# Patient Record
Sex: Male | Born: 1970 | Race: Black or African American | Hispanic: No | Marital: Married | Smoking: Never smoker
Health system: Southern US, Community
[De-identification: ages and names within clinical notes are randomized; demographics above are authoritative.]

## PROBLEM LIST (undated history)

## (undated) DIAGNOSIS — E119 Type 2 diabetes mellitus without complications: Secondary | ICD-10-CM

---

## 2015-12-06 ENCOUNTER — Emergency Department (HOSPITAL_COMMUNITY): Payer: Self-pay

## 2015-12-06 ENCOUNTER — Encounter (HOSPITAL_COMMUNITY): Payer: Self-pay | Admitting: *Deleted

## 2015-12-06 ENCOUNTER — Emergency Department (HOSPITAL_COMMUNITY)
Admission: EM | Admit: 2015-12-06 | Discharge: 2015-12-07 | Disposition: A | Discharge status: Home / Self Care | Payer: Self-pay | Attending: Emergency Medicine | Admitting: Emergency Medicine

## 2015-12-06 DIAGNOSIS — S41011A Laceration without foreign body of right shoulder, initial encounter: Secondary | ICD-10-CM | POA: Insufficient documentation

## 2015-12-06 DIAGNOSIS — N2889 Other specified disorders of kidney and ureter: Secondary | ICD-10-CM | POA: Insufficient documentation

## 2015-12-06 DIAGNOSIS — S41012A Laceration without foreign body of left shoulder, initial encounter: Secondary | ICD-10-CM | POA: Insufficient documentation

## 2015-12-06 DIAGNOSIS — Y9301 Activity, walking, marching and hiking: Secondary | ICD-10-CM | POA: Insufficient documentation

## 2015-12-06 DIAGNOSIS — T148XXA Other injury of unspecified body region, initial encounter: Secondary | ICD-10-CM

## 2015-12-06 DIAGNOSIS — Y999 Unspecified external cause status: Secondary | ICD-10-CM | POA: Insufficient documentation

## 2015-12-06 DIAGNOSIS — Y9289 Other specified places as the place of occurrence of the external cause: Secondary | ICD-10-CM | POA: Insufficient documentation

## 2015-12-06 LAB — CBC
HCT: 47.8 % (ref 39.0–52.0)
HEMOGLOBIN: 17.1 g/dL — AB (ref 13.0–17.0)
MCH: 30.6 pg (ref 26.0–34.0)
MCHC: 35.8 g/dL (ref 30.0–36.0)
MCV: 85.5 fL (ref 78.0–100.0)
Platelets: 224 10*3/uL (ref 150–400)
RBC: 5.59 MIL/uL (ref 4.22–5.81)
RDW: 12.6 % (ref 11.5–15.5)
WBC: 6.7 10*3/uL (ref 4.0–10.5)

## 2015-12-06 LAB — I-STAT CHEM 8, ED
BUN: 28 mg/dL — AB (ref 6–20)
CALCIUM ION: 1.03 mmol/L — AB (ref 1.15–1.40)
CHLORIDE: 107 mmol/L (ref 101–111)
Creatinine, Ser: 2 mg/dL — ABNORMAL HIGH (ref 0.61–1.24)
Glucose, Bld: 148 mg/dL — ABNORMAL HIGH (ref 65–99)
HEMATOCRIT: 49 % (ref 39.0–52.0)
Hemoglobin: 16.7 g/dL (ref 13.0–17.0)
Potassium: 3.4 mmol/L — ABNORMAL LOW (ref 3.5–5.1)
SODIUM: 140 mmol/L (ref 135–145)
TCO2: 19 mmol/L (ref 0–100)

## 2015-12-06 LAB — I-STAT CG4 LACTIC ACID, ED: Lactic Acid, Venous: 6.43 mmol/L (ref 0.5–1.9)

## 2015-12-06 LAB — SAMPLE TO BLOOD BANK

## 2015-12-06 LAB — PROTIME-INR
INR: 1.06
Prothrombin Time: 13.9 seconds (ref 11.4–15.2)

## 2015-12-06 MED ORDER — IOPAMIDOL (ISOVUE-370) INJECTION 76%
INTRAVENOUS | Status: AC
  Administered 2015-12-06: 100 mL via INTRAVENOUS
  Filled 2015-12-06: qty 100

## 2015-12-06 MED ORDER — MORPHINE SULFATE (PF) 4 MG/ML IV SOLN
4.0000 mg | Freq: Once | INTRAVENOUS | Status: AC
  Administered 2015-12-06: 4 mg via INTRAVENOUS
  Filled 2015-12-06: qty 1

## 2015-12-06 MED ORDER — SODIUM CHLORIDE 0.9 % IV BOLUS (SEPSIS)
1000.0000 mL | Freq: Once | INTRAVENOUS | Status: AC
  Administered 2015-12-06: 1000 mL via INTRAVENOUS
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
## 2015-12-07 ENCOUNTER — Emergency Department (HOSPITAL_COMMUNITY): Payer: Self-pay

## 2015-12-07 LAB — COMPREHENSIVE METABOLIC PANEL
ALT: 20 U/L (ref 17–63)
AST: 35 U/L (ref 15–41)
Albumin: 4.7 g/dL (ref 3.5–5.0)
Alkaline Phosphatase: 62 U/L (ref 38–126)
Anion gap: 14 (ref 5–15)
BILIRUBIN TOTAL: 0.7 mg/dL (ref 0.3–1.2)
BUN: 26 mg/dL — AB (ref 6–20)
CO2: 18 mmol/L — ABNORMAL LOW (ref 22–32)
CREATININE: 1.96 mg/dL — AB (ref 0.61–1.24)
Calcium: 9.2 mg/dL (ref 8.9–10.3)
Chloride: 106 mmol/L (ref 101–111)
GFR, EST AFRICAN AMERICAN: 45 mL/min — AB (ref >60)
GFR, EST NON AFRICAN AMERICAN: 39 mL/min — AB (ref >60)
Glucose, Bld: 155 mg/dL — ABNORMAL HIGH (ref 65–99)
POTASSIUM: 3.5 mmol/L (ref 3.5–5.1)
Sodium: 138 mmol/L (ref 135–145)
TOTAL PROTEIN: 7.3 g/dL (ref 6.5–8.1)

## 2015-12-07 LAB — CDS SEROLOGY

## 2015-12-07 LAB — I-STAT CG4 LACTIC ACID, ED: LACTIC ACID, VENOUS: 1.12 mmol/L (ref 0.5–1.9)

## 2015-12-07 LAB — ETHANOL

## 2015-12-07 MED ORDER — LIDOCAINE-EPINEPHRINE (PF) 2 %-1:200000 IJ SOLN
10.0000 mL | Freq: Once | INTRAMUSCULAR | Status: AC
  Administered 2015-12-07: 10 mL
  Filled 2015-12-07: qty 10

## 2015-12-07 MED ORDER — HYDROCODONE-ACETAMINOPHEN 5-325 MG PO TABS: 1.0000 | ORAL_TABLET | Freq: Four times a day (QID) | ORAL | 0 refills | Status: DC | PRN

## 2015-12-07 MED ORDER — GADOBENATE DIMEGLUMINE 529 MG/ML IV SOLN
15.0000 mL | Freq: Once | INTRAVENOUS | Status: AC | PRN
  Administered 2015-12-07: 15 mL via INTRAVENOUS

## 2015-12-07 NOTE — ED Notes (Signed)
Drink given per Dr Wilkie AyeHorton

## 2015-12-07 NOTE — ED Notes (Signed)
Patient returned from MRI.

## 2015-12-07 NOTE — ED Notes (Signed)
Patient to MRI.

## 2015-12-07 NOTE — ED Notes (Signed)
Discharge instructions reviewed - voiced understanding 

## 2015-12-07 NOTE — ED Provider Notes (Signed)
LACERATION REPAIR Performed by: [name],Joseph Hubbard Authorized by: [name],Joseph Hubbard Consent: Verbal consent obtained. Risks and benefits: risks, benefits and alternatives were discussed Consent given by: patient Patient identity confirmed: provided demographic data Prepped and Draped in normal sterile fashion Wound explored  Laceration Location: left shoulder  Laceration Length:  5cm  No Foreign Bodies seen or palpated  Anesthesia: pt declined  Skin closure: staples  Number of sutures: 5  Technique: staples  Patient tolerance: Patient tolerated the procedure well with no immediate complications.  Pt seen by Dr. Wilkie AyeHorton please refer to her note for HPI, ROS, PE, and dispo.   [name], PA-C 12/07/15 0301    Gilda Creasehristopher J Pollina, MD 12/08/15 [phone]

## 2015-12-07 NOTE — ED Provider Notes (Signed)
8:00 AM  Called from radiology.  Overread and final read of MRI.  Mass concerning for renal cell carcinoma.  I attempted to contact patient via phone.  No answer.  [facility] RN to contact patient in regards to final read and need for urology follow-up.  Additionally, note left with case management to contact patient to help with setting up follow-up and PCP.   Shon Batonourtney F Horton, MD 12/07/15 [phone]
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
## 2015-12-09 ENCOUNTER — Encounter (HOSPITAL_COMMUNITY): Payer: Self-pay | Admitting: Urology

## 2016-04-17 ENCOUNTER — Ambulatory Visit (HOSPITAL_COMMUNITY)
Admission: EM | Admit: 2016-04-17 | Discharge: 2016-04-17 | Disposition: A | Discharge status: Home / Self Care | Payer: Self-pay | Attending: Internal Medicine | Admitting: Internal Medicine

## 2016-04-17 ENCOUNTER — Encounter (HOSPITAL_COMMUNITY): Payer: Self-pay | Admitting: *Deleted

## 2016-04-17 DIAGNOSIS — K2100 Gastro-esophageal reflux disease with esophagitis, without bleeding: Secondary | ICD-10-CM

## 2016-04-17 DIAGNOSIS — R1084 Generalized abdominal pain: Secondary | ICD-10-CM

## 2016-04-17 DIAGNOSIS — R197 Diarrhea, unspecified: Secondary | ICD-10-CM

## 2016-04-17 NOTE — Discharge Instructions (Signed)
The symptoms your describing for the upper abdominal discomfort is likely due to reflux of the stomach contents into the esophagus. Recommend taking Prilosec, comes in generic, and Zantac 150 mg twice a day. For diarrhea take Imodium, one now and may repeat in 4-6 hours if continuing to have diarrhea at the same rate. Do not take more than 3 a day. Do not stop the diarrhea. He must have some diarrhea to get rid of the infection. If you are not getting better in a couple days or get worse or see blood in the stool or start vomiting and cannot keep liquids down need to seek medical attention promptly.

## 2016-04-17 NOTE — ED Triage Notes (Signed)
Pt  Reports   abd  Gas  Pressure         Nausea   vomiting    And   Diarrhea   As   Well  X  5  Days         Last  Times   Vomited   Was  yeterday

## 2016-04-17 NOTE — ED Provider Notes (Signed)
CSN: [id number]     Arrival date & time 04/17/16  1258 History   First MD Initiated Contact with Patient 04/17/16 1425     Chief Complaint  Patient presents with  . Abdominal Pain   (Consider location/radiation/quality/duration/timing/severity/associated sxs/prior Treatment) 46 year old male complaining of pain across the upper abdomen and epigastrium for 3 days. It is associated with reflux symptoms that often go to the throat. Sometimes brushing his teeth he will gag and experience reflux into his throat. Proximally 4 days ago he started having diarrhea. He would hear louder bowel sounds and having may be 5 or 6 loose swallow water he stools per day. Occasionally has discomfort across the lower abdomen. He has been taking Pepto-Bismol almost daily. He has not seen in any bright red bleeding. States he ate some food with some greens on top a few days ago and he attributed his illness to that. No fever or chills. No shortness of breath, cough.      History reviewed. No pertinent past medical history. History reviewed. No pertinent surgical history. History reviewed. No pertinent family history. Social History  Substance Use Topics  . Smoking status: Never Smoker  . Smokeless tobacco: Never Used  . Alcohol use No    Review of Systems  Constitutional: Negative for activity change, fatigue and fever.  HENT: Negative.   Respiratory: Negative.   Gastrointestinal: Negative for constipation and vomiting.       See history of present illness  Genitourinary: Negative.   Musculoskeletal: Negative.   Neurological: Negative.   All other systems reviewed and are negative.   Allergies  Patient has no known allergies.  Home Medications   Prior to Admission medications   Not on File   Meds Ordered and Administered this Visit  Medications - No data to display  BP 124/71 (BP Location: Right Arm)   Pulse 60   Temp 98.6 F (37 C) (Oral)   Resp 18   SpO2 100%  No data  found.   Physical Exam  Constitutional: He is oriented to person, place, and time. He appears well-developed and well-nourished. No distress.  HENT:  Head: Normocephalic and atraumatic.  Eyes: EOM are normal.  Neck: Neck supple.  Cardiovascular: Normal rate and regular rhythm.   Pulmonary/Chest: Effort normal and breath sounds normal. No respiratory distress. He has no wheezes.  Abdominal: Soft. He exhibits no mass.  Bowel sounds hyperactive. Minor abdominal distention. Palpation of the upper abdomen with tympany most other areas dull. No areas with tenderness, rebound or guarding.  Musculoskeletal: He exhibits no edema.  Neurological: He is alert and oriented to person, place, and time.  Skin: Skin is warm and dry.  Psychiatric: He has a normal mood and affect.  Nursing note and vitals reviewed.   Urgent Care Course     Procedures (including critical care time)  Labs Review Labs Reviewed - No data to display  Imaging Review No results found.   Visual Acuity Review  Right Eye Distance:   Left Eye Distance:   Bilateral Distance:    Right Eye Near:   Left Eye Near:    Bilateral Near:         MDM   1. Diarrhea, unspecified type   2. Reflux esophagitis   3. Generalized abdominal pain    The symptoms your describing for the upper abdominal discomfort is likely due to reflux of the stomach contents into the esophagus. Recommend taking Prilosec, comes in generic, and Zantac 150 mg twice a  day. For diarrhea take Imodium, one now and may repeat in 4-6 hours if continuing to have diarrhea at the same rate. Do not take more than 3 a day. Do not stop the diarrhea. He must have some diarrhea to get rid of the infection. If you are not getting better in a couple days or get worse or see blood in the stool or start vomiting and cannot keep liquids down need to seek medical attention promptly.    Hayden Rasmussenavid Kristi Norment, NP 04/17/16 [phone]
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
## 2021-01-30 ENCOUNTER — Other Ambulatory Visit (HOSPITAL_COMMUNITY): Payer: Self-pay

## 2021-01-30 ENCOUNTER — Telehealth: Payer: Self-pay

## 2021-01-30 NOTE — Telephone Encounter (Signed)
[facility] Patient Product/process development scientist completed.    The patient is insured through [employer].  [employer] $1062.69  [employer] $2800.00  Patient will need a Copay Coupon Card / sign up for [employer] .  We will continue to follow to see if copay assistance is needed.  Clearance Coots, CPhT Specialty Pharm[facility] Patient [facility] for Infectious Disease Phone: [phone] Fax:  [phone]

## 2021-01-31 ENCOUNTER — Ambulatory Visit: Payer: Self-pay | Admitting: Internal Medicine

## 2021-01-31 NOTE — Progress Notes (Deleted)
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 male with PMHx as below who presents to the clinic for discussion of a false positive HIV test.   ***  Patient's Medications   No medications on file      No past medical history on file.  Social History   Tobacco Use   Smoking status: Never   Smokeless tobacco: Never  Substance Use Topics   Alcohol use: No    No family history on file.  No Known Allergies  ROS    OBJECTIVE:    There were no vitals filed for this visit.   There is no height or weight on file to calculate BMI.  Physical Exam   Labs and Microbiology:  CBC Latest Ref Rng & Units 12/06/2015 12/06/2015  WBC 4.0 - 10.5 K/uL - 6.7  Hemoglobin 13.0 - 17.0 g/dL 16.7 17.1(H)  Hematocrit 39.0 - 52.0 % 49.0 47.8  Platelets 150 - 400 K/uL - 224   CMP Latest Ref Rng & Units 12/06/2015 12/06/2015  Glucose 65 - 99 mg/dL 148(H) 155(H)  BUN 6 - 20 mg/dL 28(H) 26(H)  Creatinine 0.61 - 1.24 mg/dL 2.00(H) 1.96(H)  Sodium 135 - 145 mmol/L 140 138  Potassium 3.5 - 5.1 mmol/L 3.4(L) 3.5  Chloride 101 - 111 mmol/L 107 106  CO2 22 - 32 mmol/L - 18(L)  Calcium 8.9 - 10.3 mg/dL - 9.2  Total Protein 6.5 - 8.1 g/dL - 7.3  Total Bilirubin 0.3 - 1.2 mg/dL - 0.7  Alkaline Phos 38 - 126 U/L - 62  AST 15 - 41 U/L - 35  ALT 17 - 63 U/L - 20     No results found for this or any previous visit (from the past 240 hour(s)).  Imaging: ***   ASSESSMENT & PLAN:    No problem-specific Assessment & Plan notes found for this encounter.   No orders of the defined types were placed in this encounter.     ***  Rayn[facility] 01/31/2021, 5:38 AM

## 2022-04-02 ENCOUNTER — Other Ambulatory Visit: Payer: Self-pay | Admitting: Physician Assistant

## 2022-04-02 DIAGNOSIS — R748 Abnormal levels of other serum enzymes: Secondary | ICD-10-CM

## 2022-04-23 ENCOUNTER — Ambulatory Visit
Admission: RE | Admit: 2022-04-23 | Discharge: 2022-04-23 | Disposition: A | Discharge status: Home / Self Care | Payer: Self-pay | Source: Ambulatory Visit | Attending: Physician Assistant | Admitting: Physician Assistant

## 2022-04-23 DIAGNOSIS — R748 Abnormal levels of other serum enzymes: Secondary | ICD-10-CM

## 2022-07-31 ENCOUNTER — Encounter (HOSPITAL_COMMUNITY): Payer: Self-pay | Admitting: Emergency Medicine

## 2022-07-31 ENCOUNTER — Emergency Department (HOSPITAL_COMMUNITY): Payer: Commercial Managed Care - HMO

## 2022-07-31 ENCOUNTER — Emergency Department (HOSPITAL_COMMUNITY)
Admission: EM | Admit: 2022-07-31 | Discharge: 2022-07-31 | Disposition: A | Discharge status: Home / Self Care | Payer: Commercial Managed Care - HMO | Attending: Emergency Medicine | Admitting: Emergency Medicine

## 2022-07-31 ENCOUNTER — Other Ambulatory Visit: Payer: Self-pay

## 2022-07-31 DIAGNOSIS — E871 Hypo-osmolality and hyponatremia: Secondary | ICD-10-CM | POA: Diagnosis not present

## 2022-07-31 DIAGNOSIS — R1031 Right lower quadrant pain: Secondary | ICD-10-CM

## 2022-07-31 DIAGNOSIS — R7401 Elevation of levels of liver transaminase levels: Secondary | ICD-10-CM | POA: Insufficient documentation

## 2022-07-31 DIAGNOSIS — N281 Cyst of kidney, acquired: Secondary | ICD-10-CM | POA: Diagnosis not present

## 2022-07-31 DIAGNOSIS — E860 Dehydration: Secondary | ICD-10-CM | POA: Insufficient documentation

## 2022-07-31 DIAGNOSIS — E1165 Type 2 diabetes mellitus with hyperglycemia: Secondary | ICD-10-CM | POA: Diagnosis not present

## 2022-07-31 HISTORY — DX: Type 2 diabetes mellitus without complications: E11.9

## 2022-07-31 LAB — COMPREHENSIVE METABOLIC PANEL
ALT: 41 U/L (ref 0–44)
AST: 52 U/L — ABNORMAL HIGH (ref 15–41)
Albumin: 3.9 g/dL (ref 3.5–5.0)
Alkaline Phosphatase: 212 U/L — ABNORMAL HIGH (ref 38–126)
Anion gap: 14 (ref 5–15)
BUN: 22 mg/dL — ABNORMAL HIGH (ref 6–20)
CO2: 19 mmol/L — ABNORMAL LOW (ref 22–32)
Calcium: 8.6 mg/dL — ABNORMAL LOW (ref 8.9–10.3)
Chloride: 100 mmol/L (ref 98–111)
Creatinine, Ser: 1.14 mg/dL (ref 0.61–1.24)
GFR, Estimated: >60 mL/min (ref >60)
Glucose, Bld: 476 mg/dL — ABNORMAL HIGH (ref 70–99)
Potassium: 4.3 mmol/L (ref 3.5–5.1)
Sodium: 133 mmol/L — ABNORMAL LOW (ref 135–145)
Total Bilirubin: 1 mg/dL (ref 0.3–1.2)
Total Protein: 6.8 g/dL (ref 6.5–8.1)

## 2022-07-31 LAB — I-STAT VENOUS BLOOD GAS, ED
Acid-Base Excess: 1 mmol/L (ref 0.0–2.0)
Bicarbonate: 28.5 mmol/L — ABNORMAL HIGH (ref 20.0–28.0)
Calcium, Ion: 1.12 mmol/L — ABNORMAL LOW (ref 1.15–1.40)
HCT: 42 % (ref 39.0–52.0)
Hemoglobin: 14.3 g/dL (ref 13.0–17.0)
O2 Saturation: 91 %
Potassium: 4.4 mmol/L (ref 3.5–5.1)
Sodium: 133 mmol/L — ABNORMAL LOW (ref 135–145)
TCO2: 30 mmol/L (ref 22–32)
pCO2, Ven: 56.4 mmHg (ref 44–60)
pH, Ven: 7.312 (ref 7.25–7.43)
pO2, Ven: 68 mmHg — ABNORMAL HIGH (ref 32–45)

## 2022-07-31 LAB — URINALYSIS, ROUTINE W REFLEX MICROSCOPIC
Bacteria, UA: NONE SEEN
Bilirubin Urine: NEGATIVE
Glucose, UA: >=500 mg/dL — AB
Hgb urine dipstick: NEGATIVE
Ketones, ur: 5 mg/dL — AB
Leukocytes,Ua: NEGATIVE
Nitrite: NEGATIVE
Protein, ur: NEGATIVE mg/dL
Specific Gravity, Urine: 1.029 (ref 1.005–1.030)
pH: 5 (ref 5.0–8.0)

## 2022-07-31 LAB — CBC
HCT: 45.1 % (ref 39.0–52.0)
Hemoglobin: 16.7 g/dL (ref 13.0–17.0)
MCH: 31.5 pg (ref 26.0–34.0)
MCHC: 37 g/dL — ABNORMAL HIGH (ref 30.0–36.0)
MCV: 84.9 fL (ref 80.0–100.0)
Platelets: 221 10*3/uL (ref 150–400)
RBC: 5.31 MIL/uL (ref 4.22–5.81)
RDW: 11.9 % (ref 11.5–15.5)
WBC: 4.9 10*3/uL (ref 4.0–10.5)
nRBC: 0 % (ref 0.0–0.2)

## 2022-07-31 LAB — LIPASE, BLOOD: Lipase: 32 U/L (ref 11–51)

## 2022-07-31 MED ORDER — DICYCLOMINE HCL 20 MG PO TABS: 20.0000 mg | ORAL_TABLET | Freq: Two times a day (BID) | ORAL | 0 refills | Status: AC | PRN

## 2022-07-31 MED ORDER — MORPHINE SULFATE (PF) 4 MG/ML IV SOLN
4.0000 mg | Freq: Once | INTRAVENOUS | Status: AC
  Administered 2022-07-31: 4 mg via INTRAVENOUS
  Filled 2022-07-31: qty 1

## 2022-07-31 MED ORDER — IOHEXOL 350 MG/ML SOLN
75.0000 mL | Freq: Once | INTRAVENOUS | Status: AC | PRN
  Administered 2022-07-31: 75 mL via INTRAVENOUS

## 2022-07-31 MED ORDER — ONDANSETRON HCL 4 MG/2ML IJ SOLN
4.0000 mg | Freq: Once | INTRAMUSCULAR | Status: AC
  Administered 2022-07-31: 4 mg via INTRAVENOUS
  Filled 2022-07-31: qty 2

## 2022-07-31 MED ORDER — ONDANSETRON HCL 4 MG PO TABS: 4.0000 mg | ORAL_TABLET | Freq: Three times a day (TID) | ORAL | 0 refills | Status: AC | PRN

## 2022-07-31 NOTE — ED Provider Notes (Signed)
[place] EMERGENCY DEPARTMENT AT [facility] Provider Note   CSN: [id number] Arrival date & time: 07/31/22  1358     History  Chief Complaint  Patient presents with   Abdominal Pain    Joseph Hubbard is a 52 y.o. male with past medical history diabetes who presents to the ED complaining of right lower quadrant abdominal pain since yesterday.  No associated nausea, vomiting, diarrhea, fever, chills, constipation, dysuria or hematuria, testicular pain or swelling, or penile discharge.  States that the pain radiates around the right flank to the right lower back.  No bowel or bladder dysfunction.  No previous abdominal surgeries.  No medications taken before arrival.      Home Medications Insulin  Allergies    Patient has no known allergies.    Review of Systems   Review of Systems  All other systems reviewed and are negative.   Physical Exam Updated Vital Signs BP 119/84 (BP Location: Right Arm)   Pulse 65   Temp 98.6 F (37 C) (Oral)   Resp 14   Ht 5\' 10"  (1.778 m)   Wt 82.6 kg   SpO2 100%   BMI 26.11 kg/m  Physical Exam Vitals and nursing note reviewed.  Constitutional:      General: He is not in acute distress.    Appearance: Normal appearance.  HENT:     Head: Normocephalic and atraumatic.     Mouth/Throat:     Mouth: Mucous membranes are moist.  Eyes:     General: No scleral icterus.    Extraocular Movements: Extraocular movements intact.     Conjunctiva/sclera: Conjunctivae normal.     Pupils: Pupils are equal, round, and reactive to light.  Cardiovascular:     Rate and Rhythm: Normal rate and regular rhythm.     Heart sounds: No murmur heard. Pulmonary:     Effort: Pulmonary effort is normal.     Breath sounds: Normal breath sounds.  Abdominal:     General: Abdomen is flat.     Palpations: Abdomen is soft.     Tenderness: There is abdominal tenderness in the right lower quadrant. There is no right CVA tenderness, left CVA tenderness,  guarding or rebound. Positive signs include McBurney's sign. Negative signs include Murphy's sign and Rovsing's sign.  Musculoskeletal:        General: Normal range of motion.     Cervical back: Neck supple.     Right lower leg: No edema.     Left lower leg: No edema.  Skin:    General: Skin is warm and dry.     Capillary Refill: Capillary refill takes less than 2 seconds.  Neurological:     General: No focal deficit present.     Mental Status: He is alert and oriented to person, place, and time. Mental status is at baseline.  Psychiatric:        Mood and Affect: Mood normal.        Behavior: Behavior normal.     ED Results / Procedures / Treatments   Labs (all labs ordered are listed, but only abnormal results are displayed) Labs Reviewed  COMPREHENSIVE METABOLIC PANEL - Abnormal; Notable for the following components:      Result Value   Sodium 133 (*)    CO2 19 (*)    Glucose, Bld 476 (*)    BUN 22 (*)    Calcium 8.6 (*)    AST 52 (*)    Alkaline Phosphatase 212 (*)  All other components within normal limits  CBC - Abnormal; Notable for the following components:   MCHC 37.0 (*)    All other components within normal limits  URINALYSIS, ROUTINE W REFLEX MICROSCOPIC - Abnormal; Notable for the following components:   Color, Urine STRAW (*)    Glucose, UA >=500 (*)    Ketones, ur 5 (*)    All other components within normal limits  I-STAT VENOUS BLOOD GAS, ED - Abnormal; Notable for the following components:   pO2, Ven 68 (*)    Bicarbonate 28.5 (*)    Sodium 133 (*)    Calcium, Ion 1.12 (*)    All other components within normal limits  LIPASE, BLOOD    EKG None  Radiology CT ABDOMEN PELVIS W CONTRAST  Result Date: 07/31/2022 CLINICAL DATA:  Right lower quadrant pain EXAM: CT ABDOMEN AND PELVIS WITH CONTRAST TECHNIQUE: Multidetector CT imaging of the abdomen and pelvis was performed using the standard protocol following bolus administration of intravenous  contrast. RADIATION DOSE REDUCTION: This exam was performed according to the departmental dose-optimization program which includes automated exposure control, adjustment of the mA and/or kV according to patient size and/or use of iterative reconstruction technique. CONTRAST:  75mL OMNIPAQUE IOHEXOL 350 MG/ML SOLN COMPARISON:  None Available. FINDINGS: Lower chest: Bibasilar atelectasis.  No effusions. Hepatobiliary: No focal hepatic abnormality. Gallbladder unremarkable. Pancreas: No focal abnormality or ductal dilatation. Spleen: No focal abnormality.  Normal size. Adrenals/Urinary Tract: Partially calcified cyst in the midpole of the left kidney measures 1.5 cm. No renal or ureteral stones. No hydronephrosis. Adrenal glands and urinary bladder unremarkable. Stomach/Bowel: Normal appendix. Stomach, large and small bowel grossly unremarkable. Vascular/Lymphatic: No evidence of aneurysm or adenopathy. Reproductive: Insert nail pelvis Other: No free fluid or free air. Musculoskeletal: No acute bony abnormality. IMPRESSION: Normal appendix. No acute findings in the abdomen or pelvis. Rim calcified 1.5 cm cyst in the midpole of the left kidney. This likely reflects a complex benign renal cyst, but could be further characterized with elective nonemergent renal ultrasound. Electronically Signed   By: Charlett Nose M.D.   On: 07/31/2022 17:42    Procedures Procedures    Medications Ordered in ED Medications  morphine (PF) 4 MG/ML injection 4 mg (4 mg Intravenous Given 07/31/22 1641)  ondansetron (ZOFRAN) injection 4 mg (4 mg Intravenous Given 07/31/22 1640)  iohexol (OMNIPAQUE) 350 MG/ML injection 75 mL (75 mLs Intravenous Contrast Given 07/31/22 1727)    ED Course/ Medical Decision Making/ A&P Clinical Course as of 07/31/22 1831  Tue Jul 31, 2022  1820 Discussed findings with patient and family member at bedside.  He is resting comfortably in no acute distress.  No appendicitis, cholecystitis on CT abdomen  pelvis.  He does have incidental left renal cyst.  No pain on the left side of his abdomen or left CVA tenderness.  Urine is negative for infection. Also elevated alk phos from previous labs. Will have pt closely follow up with primary care for outpatient renal ultrasound and further evaluation.  Blood sugar is elevated in the 400s.  No anion gap.  pH is normal.  Patient newly diagnosed diabetic currently using insulin at home.  Will have him closely monitor blood sugar at home and follow-up again with PCP for discussion of further medication management. [MG]    Clinical Course User Index [MG] Tonette Lederer, PA-C  Medical Decision Making Amount and/or Complexity of Data Reviewed Labs: ordered. Decision-making details documented in ED Course. Radiology: ordered. Decision-making details documented in ED Course.  Risk Prescription drug management.   Medical Decision Making:   Joseph Hubbard is a 52 y.o. male who presented to the ED today with abdominal pain detailed above.    Patient's presentation is complicated by their history of diabetes.  Complete initial physical exam performed, notably the patient  was in no acute distress.  Moderate right lower quadrant abdominal tenderness with positive McBurney's point tenderness.  No rebound, guarding.  No CVA tenderness.  Moist mucous membranes.  Nontoxic-appearing.    Reviewed and confirmed nursing documentation for past medical history, family history, social history.    Initial Assessment:   With the patient's presentation of abdominal pain, differential diagnosis includes but is not limited to AAA, mesenteric ischemia, appendicitis, diverticulitis, DKA, gastritis, gastroenteritis, AMI, nephrolithiasis, pancreatitis, peritonitis, adrenal insufficiency, intestinal ischemia, constipation, UTI, SBO/LBO, splenic rupture, biliary disease, IBD, IBS, PUD, hepatitis, STD, ovarian/testicular torsion, electrolyte disturbance,  DKA, dehydration, acute kidney injury, renal failure, cholecystitis, cholelithiasis, choledocholithiasis, abdominal pain of  unknown etiology.   Initial Plan:  Screening labs including CBC and Metabolic panel to evaluate for infectious or metabolic etiology of disease.  Lipase to evaluate for pancreatitis Urinalysis with reflex culture ordered to evaluate for UTI or relevant urologic/nephrologic pathology.  CT abd/pelvis to evaluate for intra-abdominal pathology Symptomatic management Objective evaluation as reviewed   Initial Study Results:   Laboratory  All laboratory results reviewed without evidence of clinically relevant pathology.   Exceptions include: Sodium 133, glucose 476, alk phos 212, AST 52  Radiology:  All images reviewed independently. Agree with radiology report at this time.   CT ABDOMEN PELVIS W CONTRAST  Result Date: 07/31/2022 CLINICAL DATA:  Right lower quadrant pain EXAM: CT ABDOMEN AND PELVIS WITH CONTRAST TECHNIQUE: Multidetector CT imaging of the abdomen and pelvis was performed using the standard protocol following bolus administration of intravenous contrast. RADIATION DOSE REDUCTION: This exam was performed according to the departmental dose-optimization program which includes automated exposure control, adjustment of the mA and/or kV according to patient size and/or use of iterative reconstruction technique. CONTRAST:  75mL OMNIPAQUE IOHEXOL 350 MG/ML SOLN COMPARISON:  None Available. FINDINGS: Lower chest: Bibasilar atelectasis.  No effusions. Hepatobiliary: No focal hepatic abnormality. Gallbladder unremarkable. Pancreas: No focal abnormality or ductal dilatation. Spleen: No focal abnormality.  Normal size. Adrenals/Urinary Tract: Partially calcified cyst in the midpole of the left kidney measures 1.5 cm. No renal or ureteral stones. No hydronephrosis. Adrenal glands and urinary bladder unremarkable. Stomach/Bowel: Normal appendix. Stomach, large and small bowel  grossly unremarkable. Vascular/Lymphatic: No evidence of aneurysm or adenopathy. Reproductive: Insert nail pelvis Other: No free fluid or free air. Musculoskeletal: No acute bony abnormality. IMPRESSION: Normal appendix. No acute findings in the abdomen or pelvis. Rim calcified 1.5 cm cyst in the midpole of the left kidney. This likely reflects a complex benign renal cyst, but could be further characterized with elective nonemergent renal ultrasound. Electronically Signed   By: Charlett Nose M.D.   On: 07/31/2022 17:42    Final Assessment and Plan:   52 year old male presents to the ED with right lower quadrant abdominal pain.  Vital signs reassuring.  Patient afebrile, no associated volume losses.  No urinary symptoms.  No testicular pain or swelling.  Labs initiated as above for further assessment.  No leukocytosis.  Patient does have glucose in the 470s.  Newly diagnosed diabetic currently on  insulin regimen. No anion gap, normal pH. Appears slightly dehydrated on labs. Tolerating PO, will allow to orally rehydrate. Alk phos elevated from previous though labs are several years old. CTAP without emergent findings. Will have pt closely follow up with primary care for further management of his diabetes.  Incidental left renal cyst.  No associated pain on the left side of the abdomen or back.  Recommendation for outpatient renal ultrasound.  Discussed this with patient and family member at bedside and they are agreeable to follow-up.  Urine does not appear acutely infected.  No hematuria.  Overall no emergent findings on workup.  Patient given strict ED return precautions, all questions answered, and stable for discharge.   Clinical Impression:  1. Right lower quadrant abdominal pain   2. Renal cyst, left   3. Hyperglycemia due to diabetes mellitus (HCC)   4. Dehydration      Discharge           Final Clinical Impression(s) / ED Diagnoses Final diagnoses:  Right lower quadrant abdominal pain   Renal cyst, left  Hyperglycemia due to diabetes mellitus (HCC)  Dehydration    Rx / DC Orders ED Discharge Orders          Ordered    dicyclomine (BENTYL) 20 MG tablet  2 times daily PRN        07/31/22 1824    ondansetron (ZOFRAN) 4 MG tablet  Every 8 hours PRN        07/31/22 1824              Tonette Lederer, PA-C 07/31/22 1833    Gloris Manchester, MD 08/01/22 [phone]

## 2022-07-31 NOTE — Discharge Instructions (Addendum)
Thank you for letting us take care of you today.  We do not see any emergent problems on the CT scan of your abdomen.  Your sugar was elevated in the 400s but you did not appear to be in diabetic ketoacidosis which is a diabetic emergency. We do see an incidental cyst on your left kidney. Follow up with your PCP in the next week to discuss this, lab work today, and scheduling an ultrasound of your kidneys for better characterization of this. You appear slightly dehydrated which is also common in diabetics. I recommend increasing your water intake at home which can help with both hydration and blood sugar control.  Monitor your blood sugars over the next week until you follow up with your PCP. If they remain elevated, you may need adjustment of your diabetic medications. For new or worsening symptoms, return to nearest ED for re-evaluation.

## 2022-07-31 NOTE — ED Triage Notes (Signed)
Patient arrives ambulatory by POV c/o lower right quadrant abdominal pain radiating around into back onset of yesterday. Denies any N/V/D or any urinary symptoms.

## 2023-12-22 ENCOUNTER — Emergency Department (HOSPITAL_COMMUNITY): Payer: Self-pay

## 2023-12-22 ENCOUNTER — Emergency Department (HOSPITAL_COMMUNITY)
Admission: EM | Admit: 2023-12-22 | Discharge: 2023-12-22 | Disposition: A | Discharge status: Home / Self Care | Payer: Self-pay | Attending: Emergency Medicine | Admitting: Emergency Medicine

## 2023-12-22 ENCOUNTER — Other Ambulatory Visit: Payer: Self-pay

## 2023-12-22 DIAGNOSIS — N452 Orchitis: Secondary | ICD-10-CM | POA: Insufficient documentation

## 2023-12-22 DIAGNOSIS — E119 Type 2 diabetes mellitus without complications: Secondary | ICD-10-CM | POA: Insufficient documentation

## 2023-12-22 LAB — URINALYSIS, ROUTINE W REFLEX MICROSCOPIC
Glucose, UA: >=500 mg/dL — AB
Hgb urine dipstick: NEGATIVE
Ketones, ur: >80 mg/dL — AB
Leukocytes,Ua: NEGATIVE
Nitrite: NEGATIVE
Protein, ur: NEGATIVE mg/dL
Specific Gravity, Urine: 1.025 (ref 1.005–1.030)
pH: 6 (ref 5.0–8.0)

## 2023-12-22 LAB — URINALYSIS, MICROSCOPIC (REFLEX): Bacteria, UA: NONE SEEN

## 2023-12-22 MED ORDER — CEFTRIAXONE SODIUM 500 MG IJ SOLR
500.0000 mg | Freq: Once | INTRAMUSCULAR | Status: AC
  Administered 2023-12-22: 500 mg via INTRAMUSCULAR
  Filled 2023-12-22: qty 500

## 2023-12-22 MED ORDER — LEVOFLOXACIN 500 MG PO TABS: 500.0000 mg | ORAL_TABLET | Freq: Every day | ORAL | 0 refills | Status: AC

## 2023-12-22 NOTE — ED Provider Notes (Signed)
 [place] EMERGENCY DEPARTMENT AT [facility] Provider Note   CSN: [id number] Arrival date & time: 12/22/23  0340     Patient presents with: Male GU Problem   Joseph Hubbard is a 53 y.o. male.   53 year old male presents to the emergency department for evaluation of scrotal swelling.  He began to notice swelling of his left hemiscrotum 1 week ago.  This has been largely unchanged, but patient developed pain to the area tonight.  He has not taken any medications for pain.  Does not notice any change in swelling with position change or when bearing down.  He has had some urinary hesitancy in the past, but denies any changes to urination or inability to void since onset of his scrotal swelling.  No penile discharge, fevers, abdominal pain, vomiting, genital or testicular trauma.  He is sexually active with women and denies concern for STDs.  The history is provided by the patient. No language interpreter was used.  Male GU Problem      Prior to Admission medications   Medication Sig Start Date End Date Taking? Authorizing Provider  dicyclomine  (BENTYL ) 20 MG tablet Take 1 tablet (20 mg total) by mouth 2 (two) times daily as needed for up to 3 days for spasms. 07/31/22 08/03/22  Gowens, Mariah L, PA-C    Allergies: Patient has no known allergies.    Review of Systems Ten systems reviewed and are negative for acute change, except as noted in the HPI.    Updated Vital Signs BP 132/82 (BP Location: Right Arm)   Pulse 72   Temp (!) 97.5 F (36.4 C)   Resp 18   Ht 5' 11 (1.803 m)   Wt 70.3 kg   SpO2 100%   BMI 21.62 kg/m   Physical Exam Vitals and nursing note reviewed.  Constitutional:      General: He is not in acute distress.    Appearance: He is well-developed. He is not diaphoretic.     Comments: Nontoxic appearing, NAD  HENT:     Head: Normocephalic and atraumatic.  Eyes:     General: No scleral icterus.    Conjunctiva/sclera: Conjunctivae normal.   Pulmonary:     Effort: Pulmonary effort is normal. No respiratory distress.  Genitourinary:    Penis: Circumcised.      Comments: Exam chaperoned by RN. No hernia appreciated in inguinal canal. There is swelling of the L hemscrotum. No overlying erythema, lesions, heat to touch. No crepitus. Musculoskeletal:        General: Normal range of motion.     Cervical back: Normal range of motion.  Skin:    General: Skin is warm and dry.     Coloration: Skin is not pale.     Findings: No erythema or rash.  Neurological:     Mental Status: He is alert and oriented to person, place, and time.  Psychiatric:        Behavior: Behavior normal.     (all labs ordered are listed, but only abnormal results are displayed) Labs Reviewed  URINALYSIS, ROUTINE W REFLEX MICROSCOPIC  GC/CHLAMYDIA PROBE AMP ([place]) NOT AT [facility]    EKG: None  Radiology: US  Scrotum Addendum Date: 12/22/2023 ** ADDENDUM #1 ** ADDENDUM: According to the sonographer, the left testis was not only abnormally enlarged, it appeared abnormally hard and firm , concerning for diffusely infiltrating mass as opposed to orchitis. Given underlying microlithiasis, the findings are suspicious for testicular neoplasm as opposed to simple  orchitis. Urology consultation is recommended. The above discrepancy with the original report and the above recommendations were discussed with the ordering clinician, Ms. Burnard Light, PA at 6:19 AM 12/22/23. ---------------------------------------------------- Electronically signed by: Evalene Coho MD 12/22/2023 06:21 AM EST RP Workstation: [id number]   Result Date: 12/22/2023 ** ORIGINAL REPORT ** EXAM: ULTRASOUND SCROTUM/TESTICLES WITH DOPPLER FLOW EVALUATION 12/22/2023 05:22:44 AM TECHNIQUE: Duplex ultrasound using B-mode/gray scaled imaging, Doppler spectral analysis and color flow Doppler was obtained of the testicles. COMPARISON: None available. CLINICAL HISTORY: Scrotal swelling. FINDINGS:  RIGHT: GREY SCALE: The right testicle measures 4.1 x 2.5 x 3.3 cm. Testicular microlithiasis is present bilaterally. The right testicle demonstrates normal homogeneous echotexture without focal lesion. DOPPLER EVALUATION: There is normal arterial and venous Doppler flow within the testicle. VARICOCELE: No scrotal varicocele. SCROTAL SAC: Bilateral hydroceles are present, right greater than left. EPIDIDYMIS: No acute abnormality. LEFT: GREY SCALE: The left testicle measures 6.7 x 4.7 x 5.3 cm. Testicular microlithiasis is present bilaterally. The left testis is also abnormally enlarged and abnormally heterogeneous in echotexture. DOPPLER EVALUATION: It also demonstrates abnormally increased blood flow. VARICOCELE: No scrotal varicocele. SCROTAL SAC: Bilateral hydroceles are present, right greater than left. EPIDIDYMIS: No acute abnormality. IMPRESSION: 1. Abnormal left testis with enlargement, heterogeneous echotexture, and increased blood flow, compatible with orchitis. 2. Bilateral testicular microlithiasis. 3. Bilateral hydroceles, right greater than left. Electronically signed by: Evalene Coho MD 12/22/2023 05:36 AM EST RP Workstation: [id number]     Procedures   Medications Ordered in the ED  cefTRIAXone (ROCEPHIN) injection 500 mg (has no administration in time range)    Clinical Course as of 12/22/23 0628  Sun Dec 22, 2023  0619 Received call from radiology regarding ultrasound findings.  Radiologist was contacted by the sonographer who completed the study.  They expressed concern for malignancy.  In the setting of microlithiasis, Dr. Coho of radiology does agree that malignancy is more concerning and would advise urgent urologic consultation. Will provide referral to patient. [KH]    Clinical Course User Index [KH] Light Burnard, PA-C                                 Medical Decision Making Amount and/or Complexity of Data Reviewed Labs: ordered. Radiology:  ordered.  Risk Prescription drug management.   This patient presents to the ED for concern of swelling L hemiscrotum, this involves an extensive number of treatment options, and is a complaint that carries with it a high risk of complications and morbidity.  The differential diagnosis includes epididymitis versus orchitis versus testicular torsion versus malignancy versus Fournier's   Co morbidities that complicate the patient evaluation  DM   Additional history obtained:  External records from outside source obtained and reviewed including prior discharge summaries   Lab Tests:  I Ordered, and personally interpreted labs.  The pertinent results include:  UA pending   Imaging Studies ordered:  I ordered imaging studies including Scrotal US   I independently visualized and interpreted imaging which showed findings c/w orchitis; question possible malignancy I agree with the radiologist interpretation   Cardiac Monitoring:  The patient was maintained on a cardiac monitor.  I personally viewed and interpreted the cardiac monitored which showed an underlying rhythm of: NSR   Medicines ordered and prescription drug management:  I ordered medication including Rocephin for orhcitis  Reevaluation of the patient after these medicines showed that the patient stayed the same I have reviewed the  patients home medicines and have made adjustments as needed   Test Considered:  Chem8   Problem List / ED Course:  As above Patient presenting for swelling of the left hemiscrotum.  Ultrasound findings consistent with orchitis.  There is bilateral testicular microlithiasis as well as bilateral hydroceles. Given dose of IM Rocephin in the emergency department.  Pending urinalysis.  Patient denies concern for sexually transmitted infections, but would favor doxycycline over Levaquin if significant pyuria on UA. Later contacted by radiology secondary to concern for malignancy on  ultrasound. Will refer to Urology for follow up and outpatient reassessment. Afebrile and well appearing, ambulatory without issue.   Reevaluation:  After the interventions noted above, I reevaluated the patient and found that they have :stayed the same   Social Determinants of Health:  Insured patient   Dispostion:  Care signed out to [place], [place] at shift change pending UA results.      Final diagnoses:  Orchitis    ED Discharge Orders     None          Keith Sor, PA-C 12/22/23 9370    Palumbo, April, MD 12/22/23 [phone]

## 2023-12-22 NOTE — Discharge Instructions (Addendum)
 Your ultrasound noted swelling of the left testicle.  This may be related to orchitis.  Take the antibiotics prescribed to you until finished.  There is also possibility that the swelling of your left testicle may be related to malignancy.  For this reason, it is important that you follow-up urgently with urology.  Call on Monday to schedule a follow-up appointment.  We have also notified urology by email to be expecting your call.  In the interim, you may take Tylenol  or ibuprofen for pain.  Return to the ED for new or concerning symptoms.

## 2023-12-22 NOTE — ED Triage Notes (Signed)
 Pt states he's been having testicular swelling for 1wk but pain started today. L side hurts more. Denies urinary sx, penile discharge, fevers.

## 2023-12-23 LAB — GC/CHLAMYDIA PROBE AMP (~~LOC~~) NOT AT ARMC
Chlamydia: NEGATIVE
Comment: NEGATIVE
Comment: NORMAL
Neisseria Gonorrhea: NEGATIVE
